# Patient Record
Sex: Male | Born: 1965 | Race: White | Hispanic: No | Smoking: Never smoker
Health system: Southern US, Community
[De-identification: ages and names within clinical notes are randomized; demographics above are authoritative.]

## PROBLEM LIST (undated history)

## (undated) DIAGNOSIS — I10 Essential (primary) hypertension: Secondary | ICD-10-CM

---

## 2021-01-07 ENCOUNTER — Encounter (HOSPITAL_COMMUNITY): Payer: Self-pay

## 2021-01-07 ENCOUNTER — Other Ambulatory Visit: Payer: Self-pay

## 2021-01-07 ENCOUNTER — Emergency Department (HOSPITAL_COMMUNITY): Payer: BC Managed Care – PPO

## 2021-01-07 ENCOUNTER — Emergency Department (HOSPITAL_COMMUNITY)
Admission: EM | Admit: 2021-01-07 | Discharge: 2021-01-07 | Disposition: A | Discharge status: Other Acute Inpatient Hospital | Payer: BC Managed Care – PPO | Attending: Emergency Medicine | Admitting: Emergency Medicine

## 2021-01-07 DIAGNOSIS — W312XXA Contact with powered woodworking and forming machines, initial encounter: Secondary | ICD-10-CM | POA: Diagnosis not present

## 2021-01-07 DIAGNOSIS — S68625A Partial traumatic transphalangeal amputation of left ring finger, initial encounter: Secondary | ICD-10-CM | POA: Insufficient documentation

## 2021-01-07 DIAGNOSIS — T148XXA Other injury of unspecified body region, initial encounter: Secondary | ICD-10-CM

## 2021-01-07 DIAGNOSIS — Z20822 Contact with and (suspected) exposure to covid-19: Secondary | ICD-10-CM | POA: Diagnosis not present

## 2021-01-07 DIAGNOSIS — S61219A Laceration without foreign body of unspecified finger without damage to nail, initial encounter: Secondary | ICD-10-CM

## 2021-01-07 DIAGNOSIS — I10 Essential (primary) hypertension: Secondary | ICD-10-CM | POA: Diagnosis not present

## 2021-01-07 DIAGNOSIS — S6992XA Unspecified injury of left wrist, hand and finger(s), initial encounter: Secondary | ICD-10-CM | POA: Diagnosis present

## 2021-01-07 HISTORY — DX: Essential (primary) hypertension: I10

## 2021-01-07 LAB — CBC WITH DIFFERENTIAL/PLATELET
Abs Immature Granulocytes: 0.08 10*3/uL — ABNORMAL HIGH (ref 0.00–0.07)
Basophils Absolute: 0.1 10*3/uL (ref 0.0–0.1)
Basophils Relative: 0 %
Eosinophils Absolute: 0.1 10*3/uL (ref 0.0–0.5)
Eosinophils Relative: 1 %
HCT: 41.4 % (ref 39.0–52.0)
Hemoglobin: 13.5 g/dL (ref 13.0–17.0)
Immature Granulocytes: 1 %
Lymphocytes Relative: 16 %
Lymphs Abs: 1.9 10*3/uL (ref 0.7–4.0)
MCH: 28.6 pg (ref 26.0–34.0)
MCHC: 32.6 g/dL (ref 30.0–36.0)
MCV: 87.7 fL (ref 80.0–100.0)
Monocytes Absolute: 0.9 10*3/uL (ref 0.1–1.0)
Monocytes Relative: 8 %
Neutro Abs: 8.9 10*3/uL — ABNORMAL HIGH (ref 1.7–7.7)
Neutrophils Relative %: 74 %
Platelets: 253 10*3/uL (ref 150–400)
RBC: 4.72 MIL/uL (ref 4.22–5.81)
RDW: 12.7 % (ref 11.5–15.5)
WBC: 11.9 10*3/uL — ABNORMAL HIGH (ref 4.0–10.5)
nRBC: 0 % (ref 0.0–0.2)

## 2021-01-07 LAB — RESP PANEL BY RT-PCR (FLU A&B, COVID) ARPGX2
Influenza A by PCR: NEGATIVE
Influenza B by PCR: NEGATIVE
SARS Coronavirus 2 by RT PCR: NEGATIVE

## 2021-01-07 LAB — BASIC METABOLIC PANEL
Anion gap: 8 (ref 5–15)
BUN: 16 mg/dL (ref 6–20)
CO2: 22 mmol/L (ref 22–32)
Calcium: 8.9 mg/dL (ref 8.9–10.3)
Chloride: 106 mmol/L (ref 98–111)
Creatinine, Ser: 1.19 mg/dL (ref 0.61–1.24)
GFR, Estimated: >60 mL/min (ref >60)
Glucose, Bld: 162 mg/dL — ABNORMAL HIGH (ref 70–99)
Potassium: 4 mmol/L (ref 3.5–5.1)
Sodium: 136 mmol/L (ref 135–145)

## 2021-01-07 MED ORDER — CEFAZOLIN SODIUM-DEXTROSE 2-4 GM/100ML-% IV SOLN
2.0000 g | Freq: Once | INTRAVENOUS | Status: DC
  Filled 2021-01-07: qty 100

## 2021-01-07 MED ORDER — HYDROMORPHONE HCL 1 MG/ML IJ SOLN
1.0000 mg | Freq: Once | INTRAMUSCULAR | Status: AC
  Administered 2021-01-07: 1 mg via INTRAVENOUS
  Filled 2021-01-07: qty 1

## 2021-01-07 NOTE — ED Triage Notes (Signed)
Pt BIB [facility] from home c/o a left hand injury. Pt was in the garage using a table saw when it slipped and he cut his fingers. Pt has a chunk missing from the thumb and the pointer finger and middle finger are hanging by the skin. Pt took tylenol before coming for the pain. Pt states he last had something to eat/drink 2 hrs ago around 230.

## 2021-01-07 NOTE — ED Notes (Signed)
Called [facility] they will transport pt emergently

## 2021-01-07 NOTE — Progress Notes (Signed)
I reviewed the patient's clinical history, radiographs, and clinical photos before discussing this patient with Dr. Silverio Lay, EDP.  He confirms for me that the vascularity to the index and long fingers was questionable at best, with poor capillary refill of both, as well as the more distal injury to the ring finger.  Given this multi-digit multilevel injury with 2 dysvascular fingers, I recommended prompt referral to an appropriate tertiary level facility with readily available replantation/revascularization capabilities.  I also offered a willingness to continue to help in the appropriate disposition for this patient should difficulties with transfer arise.  Neil Crouch, MD Hand Surgery

## 2021-01-07 NOTE — ED Notes (Signed)
Called [facility] for transfer

## 2021-01-07 NOTE — ED Provider Notes (Signed)
[facility] Provider Note   CSN: [id number] Arrival date & time: 01/07/21  1623     History Chief Complaint  Patient presents with  . Finger Injury    [facility] is a 55 y.o. male history of hypertension here presenting with tablesaw injury.  Patient was cutting wood with a table saw.  He accidentally cut his left hand.  He has no other injuries.  Patient last meal was around 10 AM this morning.  Patient received his COVID vaccines and the booster has no COVID symptoms.  Patient states that his tetanus was updated in 2019.  Given pain meds prior to arrival by EMS  The history is provided by the patient.       Past Medical History:  Diagnosis Date  . Hypertension     There are no problems to display for this patient.   History reviewed. No pertinent surgical history.     History reviewed. No pertinent family history.  Social History   Tobacco Use  . Smoking status: Never Smoker  . Smokeless tobacco: Never Used    Home Medications Prior to Admission medications   Not on File    Allergies    Patient has no known allergies.  Review of Systems   Review of Systems  Musculoskeletal:       Left hand pain  All other systems reviewed and are negative.   Physical Exam Updated Vital Signs BP 118/75   Pulse 74   Temp 98.1 F (36.7 C) (Oral)   Resp 18   Ht 5\' 4"  (1.626 m)   Wt 79 kg   SpO2 99%   BMI 29.90 kg/m   Physical Exam Vitals and nursing note reviewed.  HENT:     Head: Normocephalic.     Nose: Nose normal.     Mouth/Throat:     Mouth: Mucous membranes are moist.  Eyes:     Pupils: Pupils are equal, round, and reactive to light.  Cardiovascular:     Rate and Rhythm: Normal rate.     Pulses: Normal pulses.  Pulmonary:     Effort: Pulmonary effort is normal.  Abdominal:     General: Abdomen is flat.  Musculoskeletal:     Cervical back: Normal range of motion and neck supple.     Comments: Patient has  avulsion of the palmar aspect of the left thumb.  Patient has a large laceration across the base of the second and third finger.  There is obvious tendons exposed.  Patient is unable to move the second and third finger and has diminished capillary refill and those fingers.  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  Patient is able to wiggle the finger however.  Patient is also able to move the left fifth finger.  Patient has normal capillary refill of the left fifth finger   Skin:    Capillary Refill: Capillary refill takes less than 2 seconds.  Neurological:     General: No focal deficit present.     Mental Status: He is alert and oriented to person, place, and time.  Psychiatric:        Mood and Affect: Mood normal.        Behavior: Behavior normal.         ED Results / Procedures / Treatments   Labs (all labs ordered are listed, but only abnormal results are displayed) Labs Reviewed  RESP PANEL BY RT-PCR (FLU  A&B, COVID) ARPGX2  CBC WITH DIFFERENTIAL/PLATELET  BASIC METABOLIC PANEL    EKG None  Radiology DG Hand Complete Left  Result Date: 01/07/2021 CLINICAL DATA:  Left hand injury from table saw. EXAM: LEFT HAND - COMPLETE 3+ VIEW COMPARISON:  No comparison studies available. FINDINGS: Exam limited by superimposition of bony anatomy on all images. Soft tissue defect runs from the proximal radial side of the hand at about the MCP joints up to the distal region of the ring finger. Pinky finger not well evaluated due superimposition on other anatomy. Numerous fracture fragments are seen in the region of the index finger MCP joint, arising from the head of the metacarpal and base of the proximal phalanx. There are fragments adjacent to a defect in the base of the middle finger proximal phalanx. Fracture in the region of the ring finger DIP joint is not well evaluated. Malposition of the distal phalanx of the ring finger likely secondary to partial  amputation. No evidence for retained radiopaque soft tissue foreign body. IMPRESSION: 1. Limited study due to superimposition of bony anatomy on all images. 2. Fracture with numerous tiny fragments noted in the index finger MCP joint involving the metacarpal and proximal phalanx. 3. Fragments in the region of the middle finger MCP joint are from the base of the proximal phalanx. No definite defect in the metacarpal head of the middle finger. 4. Incomplete visualization of the D IP joint in the ring finger although there is evidence of fracture and displacement of the distal phalanx likely secondary to partial amputation in this region. 5. No evidence for retained radiopaque soft tissue foreign body. Electronically Signed   By: Kennith Center M.D.   On: 01/07/2021 17:00    Procedures Procedures   CRITICAL CARE Performed by: [facility]   Total critical care time: 30 minutes  Critical care time was exclusive of separately billable procedures and treating other patients.  Critical care was necessary to treat or prevent imminent or life-threatening deterioration.  Critical care was time spent personally by me on the following activities: development of treatment plan with patient and/or surrogate as well as nursing, discussions with consultants, evaluation of patient's response to treatment, examination of patient, obtaining history from patient or surrogate, ordering and performing treatments and interventions, ordering and review of laboratory studies, ordering and review of radiographic studies, pulse oximetry and re-evaluation of patient's condition.   Medications Ordered in ED Medications  ceFAZolin (ANCEF) IVPB 2g/100 mL premix (has no administration in time range)  HYDROmorphone (DILAUDID) injection 1 mg (1 mg Intravenous Given 01/07/21 1723)    ED Course  I have reviewed the triage vital signs and the nursing notes.  Pertinent labs & imaging results that were available during my care of  the patient were reviewed by me and considered in my medical decision making (see chart for details).    MDM Rules/Calculators/A&P                         [facility] is a 55 y.o. male presenting with left hand laceration.  Patient had a table saw injury and has isolated left hand laceration.  Patient is unable to move the left second and third finger and I saw some tendons exposed.  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  Patient's tetanus is up to date.  Will give Ancef and get preop labs and COVID test and consult hand surgery.  5:30 pm I discussed case with Dr. Janee [facility] from hand surgery.  Since patient has very poor capillary refill on the second and third fingers and has difficulty moving the fingers, we recommend transfer to tertiary care center and patient may need to have the finger implanted and have vascular surgeon on board.  He states that he does not do that particular surgery.  5:41 PM Talked to Dr. Julieanne [facility] from hand surgery at [facility].  She requests emergent transfer to the ED at [facility].  They will see patient when he gets to the ER.  Final Clinical Impression(s) / ED Diagnoses Final diagnoses:  None    Rx / DC Orders ED Discharge Orders    None       Charlynne Pander, MD 01/07/21 [phone]

## 2022-11-21 IMAGING — DX DG HAND COMPLETE 3+V*L*
1 series · 4 of 4 positions shown · non-contrast
Comparison: No comparison studies available.

CLINICAL DATA: Left hand injury from table saw.

EXAM:
LEFT HAND - COMPLETE 3+ VIEW

[Series 1: hand · 0.14mm/px · 4 of 4 slices shown]
[im 1/4]
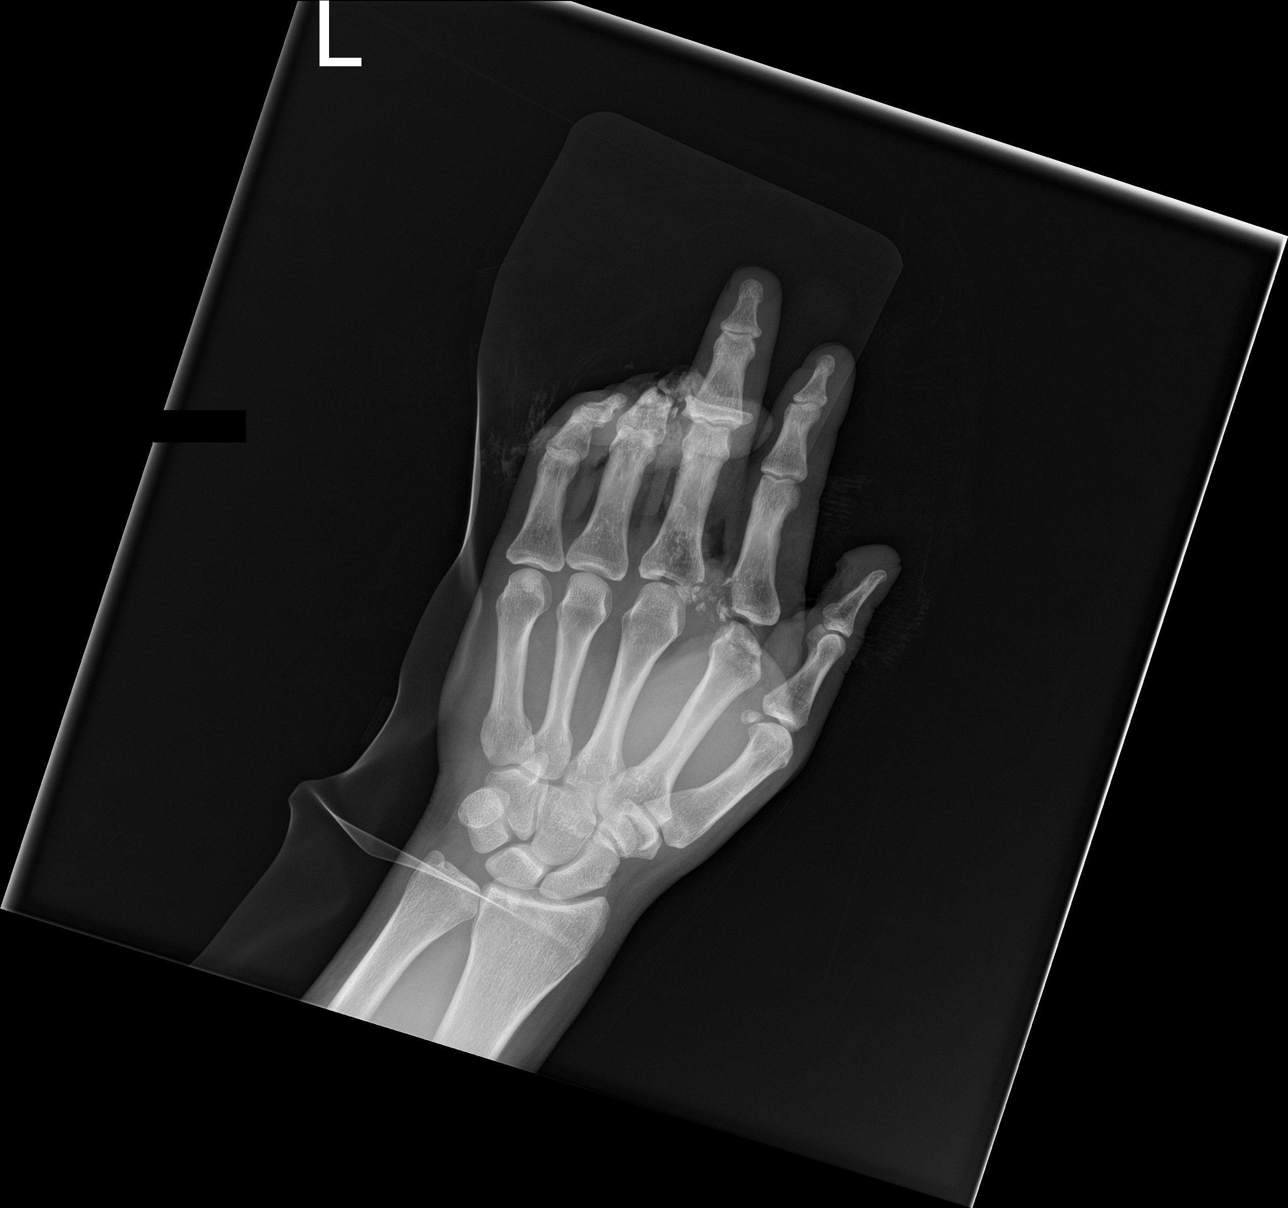
[im 2/4]
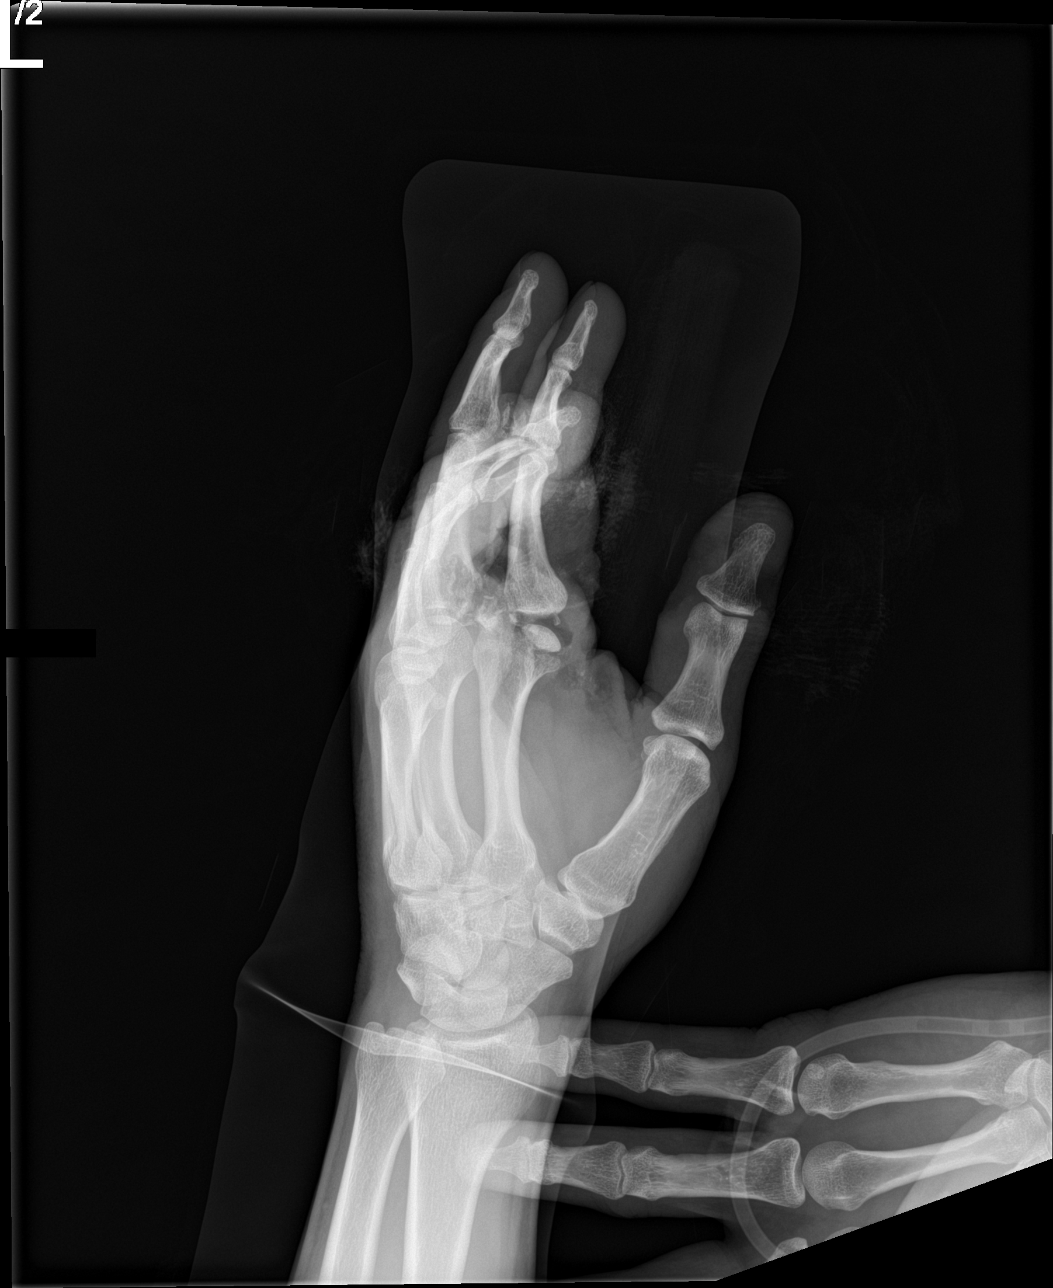
[im 3/4]
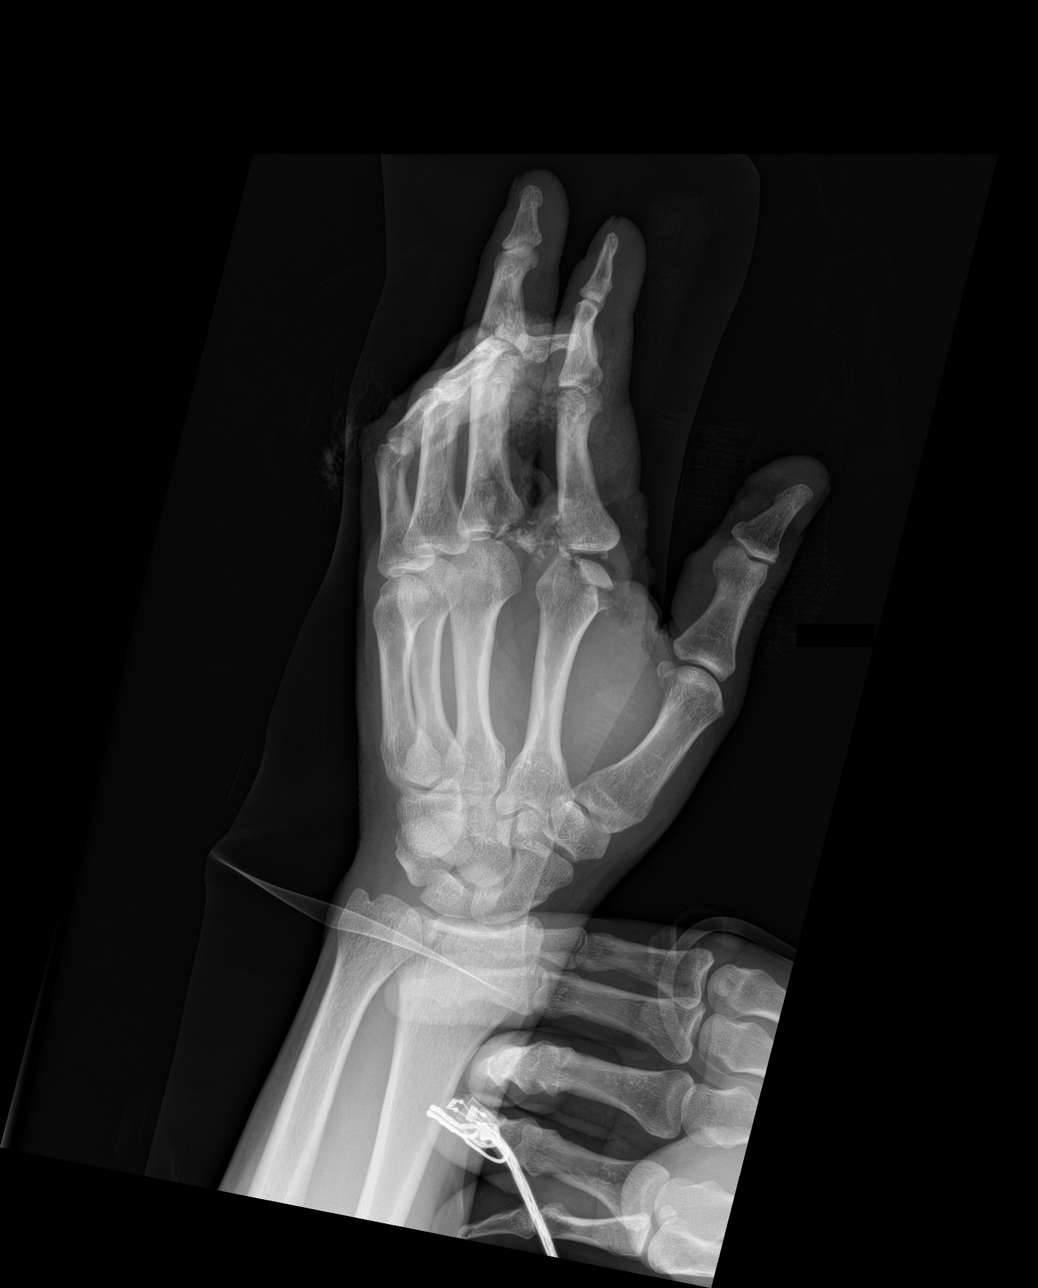
[im 4/4]
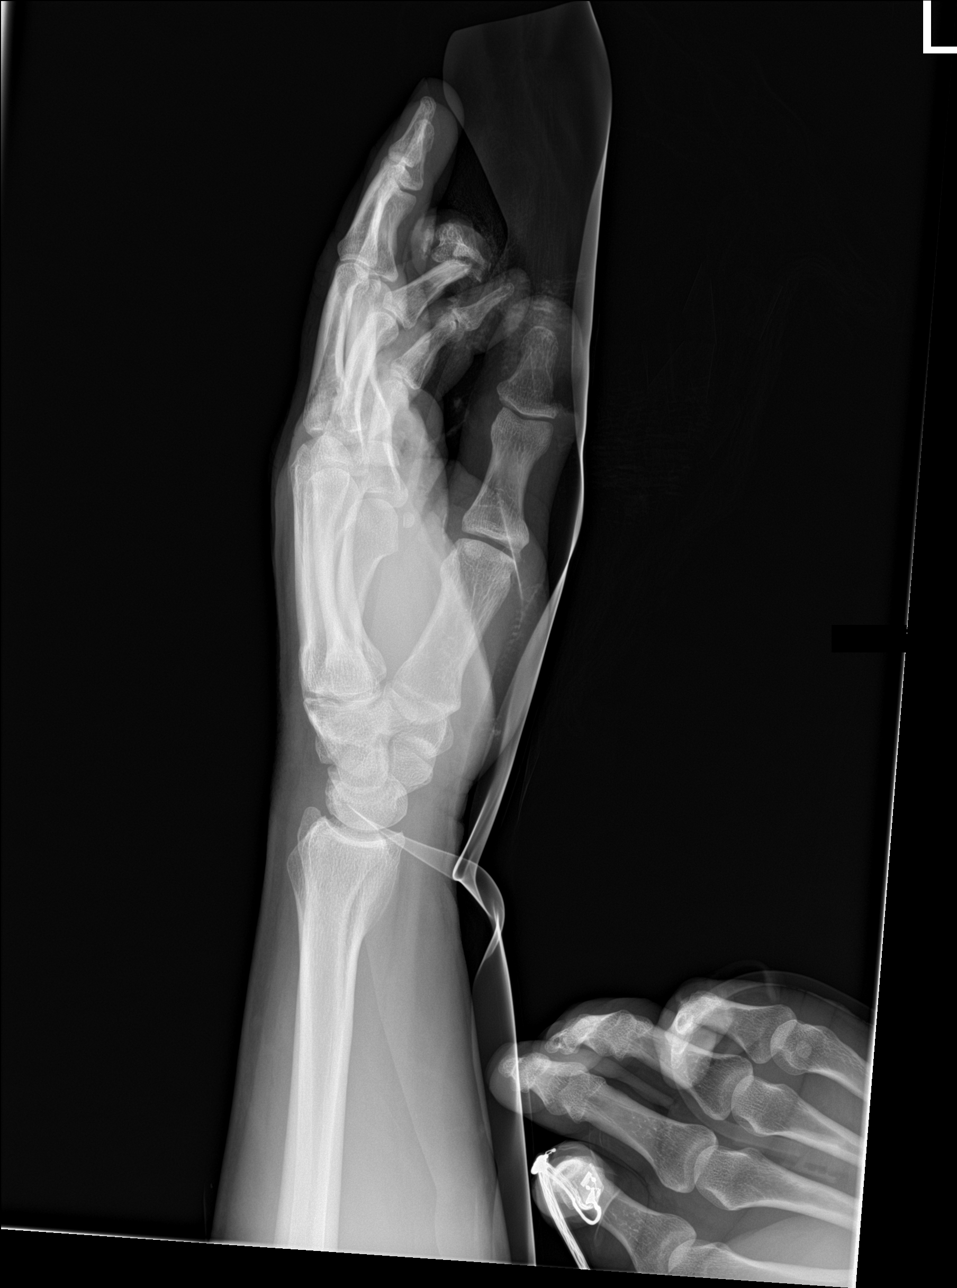

[4 of 4 positions shown; findings below may reference images not displayed]

FINDINGS: Exam limited by superimposition of bony anatomy on all images. Soft
tissue defect runs from the proximal radial side of the hand at
about the MCP joints up to the distal region of the ring finger.
Pinky finger not well evaluated due superimposition on other
anatomy. Numerous fracture fragments are seen in the region of the
index finger MCP joint, arising from the head of the metacarpal and
base of the proximal phalanx. There are fragments adjacent to a
defect in the base of the middle finger proximal phalanx. Fracture
in the region of the ring finger DIP joint is not well evaluated.
Malposition of the distal phalanx of the ring finger likely
secondary to partial amputation. No evidence for retained radiopaque
soft tissue foreign body.
IMPRESSION: 1. Limited study due to superimposition of bony anatomy on all
images.
2. Fracture with numerous tiny fragments noted in the index finger
MCP joint involving the metacarpal and proximal phalanx.
3. Fragments in the region of the middle finger MCP joint are from
the base of the proximal phalanx. No definite defect in the
metacarpal head of the middle finger.
4. Incomplete visualization of the D IP joint in the ring finger
although there is evidence of fracture and displacement of the
distal phalanx likely secondary to partial amputation in this
region.
5. No evidence for retained radiopaque soft tissue foreign body.
